# Patient Record
Sex: Female | Born: 1985 | Race: White | Hispanic: No | Marital: Single | Smoking: Current some day smoker
Health system: Southern US, Community
[De-identification: ages and names within clinical notes are randomized; demographics above are authoritative.]

## PROBLEM LIST (undated history)

## (undated) DIAGNOSIS — F329 Major depressive disorder, single episode, unspecified: Secondary | ICD-10-CM

## (undated) DIAGNOSIS — F419 Anxiety disorder, unspecified: Secondary | ICD-10-CM

## (undated) DIAGNOSIS — F32A Depression, unspecified: Secondary | ICD-10-CM

## (undated) HISTORY — DX: Major depressive disorder, single episode, unspecified: F32.9

## (undated) HISTORY — DX: Anxiety disorder, unspecified: F41.9

## (undated) HISTORY — DX: Depression, unspecified: F32.A

---
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
## 2015-06-08 ENCOUNTER — Emergency Department (HOSPITAL_COMMUNITY)
Admission: EM | Admit: 2015-06-08 | Discharge: 2015-06-09 | Disposition: A | Discharge status: Home / Self Care | Payer: Self-pay | Attending: Emergency Medicine | Admitting: Emergency Medicine

## 2015-06-08 ENCOUNTER — Encounter (HOSPITAL_COMMUNITY): Payer: Self-pay

## 2015-06-08 ENCOUNTER — Emergency Department (HOSPITAL_COMMUNITY): Payer: Self-pay

## 2015-06-08 DIAGNOSIS — R0789 Other chest pain: Secondary | ICD-10-CM | POA: Insufficient documentation

## 2015-06-08 DIAGNOSIS — Z87891 Personal history of nicotine dependence: Secondary | ICD-10-CM | POA: Insufficient documentation

## 2015-06-08 LAB — CBC
HCT: 40.2 % (ref 36.0–46.0)
Hemoglobin: 13.7 g/dL (ref 12.0–15.0)
MCH: 30.1 pg (ref 26.0–34.0)
MCHC: 34.1 g/dL (ref 30.0–36.0)
MCV: 88.4 fL (ref 78.0–100.0)
PLATELETS: 323 10*3/uL (ref 150–400)
RBC: 4.55 MIL/uL (ref 3.87–5.11)
RDW: 12.9 % (ref 11.5–15.5)
WBC: 11.5 10*3/uL — AB (ref 4.0–10.5)

## 2015-06-08 LAB — BASIC METABOLIC PANEL
Anion gap: 8 (ref 5–15)
BUN: 12 mg/dL (ref 6–20)
CALCIUM: 8.9 mg/dL (ref 8.9–10.3)
CO2: 22 mmol/L (ref 22–32)
CREATININE: 0.71 mg/dL (ref 0.44–1.00)
Chloride: 105 mmol/L (ref 101–111)
GFR calc non Af Amer: >60 mL/min (ref >60)
Glucose, Bld: 132 mg/dL — ABNORMAL HIGH (ref 65–99)
Potassium: 3.3 mmol/L — ABNORMAL LOW (ref 3.5–5.1)
SODIUM: 135 mmol/L (ref 135–145)

## 2015-06-08 LAB — I-STAT TROPONIN, ED: TROPONIN I, POC: 0 ng/mL (ref 0.00–0.08)

## 2015-06-08 NOTE — ED Provider Notes (Signed)
CSN: [id number]     Arrival date & time 06/08/15  2237 History   First MD Initiated Contact with Patient 06/08/15 2336     Chief Complaint  Patient presents with  . Chest Pain     (Consider location/radiation/quality/duration/timing/severity/associated sxs/prior Treatment) HPI Comments: Pt states she was at work tonight and started having left arm pain and then it went numb. An hour later it went away. Then she started having cp. No early familial cardiac history. No h/o DVT or PE.  Patient is a 29 y.o. female presenting with chest pain. The history is provided by the patient.  Chest Pain Pain location:  L chest Pain quality: pressure   Pain radiates to:  L arm Pain radiates to the back: no   Pain severity:  Mild Onset quality:  Sudden Duration:  8 hours Timing:  Constant Progression:  Improving Chronicity:  New Context: lifting   Relieved by:  Nothing Worsened by:  Movement Ineffective treatments: NSAIDs. Associated symptoms: no abdominal pain, no fever, no lower extremity edema, no nausea and not vomiting   Risk factors: no coronary artery disease, no diabetes mellitus, no high cholesterol, no hypertension and no prior DVT/PE     History reviewed. No pertinent past medical history. History reviewed. No pertinent past surgical history. No family history on file. Social History  Substance Use Topics  . Smoking status: Former Games developer  . Smokeless tobacco: None  . Alcohol Use: Yes     Comment: occasionally   OB History    No data available     Review of Systems  Constitutional: Negative for fever.  Cardiovascular: Positive for chest pain.  Gastrointestinal: Negative for nausea, vomiting and abdominal pain.  All other systems reviewed and are negative.     Allergies  Zithromax  Home Medications   Prior to Admission medications   Medication Sig Start Date End Date Taking? Authorizing Provider  naproxen (NAPROSYN) 500 MG tablet Take 1 tablet (500 mg total) by  mouth 2 (two) times daily with a meal. 06/09/15   Aluna Whiston, PA-C   BP 125/72 mmHg  Pulse 65  Temp(Src) 97.7 F (36.5 C) (Oral)  Resp 17  Ht  (1.778 m)  Wt 280 lb (127.007 kg)  BMI 40.18 kg/m2  SpO2 100% Physical Exam  Constitutional: She is oriented to person, place, and time. She appears well-developed and well-nourished.  HENT:  Head: Normocephalic and atraumatic.  Right Ear: External ear normal.  Left Ear: External ear normal.  Nose: Nose normal.  Eyes: Conjunctivae are normal.  Neck: Neck supple.  Cardiovascular: Normal rate, regular rhythm and normal heart sounds.   Pulmonary/Chest: Effort normal and breath sounds normal. No respiratory distress. She exhibits tenderness.  Abdominal: Soft. There is no tenderness.  Musculoskeletal: Normal range of motion. She exhibits no edema.  Neurological: She is alert and oriented to person, place, and time.  Skin: Skin is warm and dry.  Nursing note and vitals reviewed.   ED Course  Procedures (including critical care time) Labs Review Labs Reviewed  BASIC METABOLIC PANEL - Abnormal; Notable for the following:    Potassium 3.3 (*)    Glucose, Bld 132 (*)    All other components within normal limits  CBC - Abnormal; Notable for the following:    WBC 11.5 (*)    All other components within normal limits  Rosezena Sensor, ED    Imaging Review Dg Chest 2 View  06/08/2015   CLINICAL DATA:  Chest pain  EXAM: CHEST  2 VIEW  COMPARISON:  None.  FINDINGS: There is a possible 3 mm left apical pulmonary nodule. There is no focal parenchymal opacity. There is no pleural effusion or pneumothorax. The heart and mediastinal contours are unremarkable.  The osseous structures are unremarkable.  IMPRESSION: No active cardiopulmonary disease.  Possible 3 mm left apical pulmonary nodule. Recommend follow-up chest x-ray in 3-6 months.   Electronically Signed   By: Elige Ko   On: 06/08/2015 23:24   I have personally reviewed and  evaluated these images and lab results as part of my medical decision-making.   EKG Interpretation None      MDM   Final diagnoses:  Chest pain, atypical    Filed Vitals:   06/09/15 0151  BP: 125/72  Pulse: 65  Temp: 97.7 F (36.5 C)  Resp: 17   Afebrile, NAD, non-toxic appearing, AAOx4.   29 yo F presenting to the ED for CP. Low suspicion for PE as patient is not hypoxic, tachypnea or tachycardic, VSS, no tracheal deviation, no JVD or new murmur, RRR, breath sounds equal bilaterally, EKG without acute abnormalities, negative troponin, and negative CXR. Sensation intact. Moves extremities w/o ataxia. No early familial cardiac history. Advised to f/u with PCP. Return precautions discussed. Patient / Family / Caregiver informed of clinical course, understand medical decision-making and is agreeable to plan. Patient is stable at time of discharge      Francee Piccolo, PA-C 06/09/15 0250  Gilda Crease, MD 06/10/15 [phone]

## 2015-06-08 NOTE — ED Notes (Signed)
Pt states she was at work tonight and started having left arm pain and then it went numb. An hour later it went away. Then she started having cp.

## 2015-06-09 MED ORDER — TRAMADOL HCL 50 MG PO TABS
50.0000 mg | ORAL_TABLET | Freq: Once | ORAL | Status: AC
  Administered 2015-06-09: 50 mg via ORAL
  Filled 2015-06-09: qty 1

## 2015-06-09 MED ORDER — NAPROXEN 500 MG PO TABS: 500.0000 mg | ORAL_TABLET | Freq: Two times a day (BID) | ORAL | Status: AC

## 2015-06-09 NOTE — Discharge Instructions (Signed)
Please follow up with your primary care physician in 1-2 days. If you do not have one please call the [facility] and wellness Center number listed above. Your child received a long acting steroid for croup today. No further steroids are needed. If he/she has difficulty breathing, have him/her breath in cool air from the freezer or take him/her into the cool night air. If there is no improvement in 5 minutes or if your child has labored, heavy breathing return to the ED immediately.   Chest Pain (Nonspecific) It is often hard to give a specific diagnosis for the cause of chest pain. There is always a chance that your pain could be related to something serious, such as a heart attack or a blood clot in the lungs. You need to follow up with your health care provider for further evaluation. CAUSES   Heartburn.  Pneumonia or bronchitis.  Anxiety or stress.  Inflammation around your heart (pericarditis) or lung (pleuritis or pleurisy).  A blood clot in the lung.  A collapsed lung (pneumothorax). It can develop suddenly on its own (spontaneous pneumothorax) or from trauma to the chest.  Shingles infection (herpes zoster virus). The chest wall is composed of bones, muscles, and cartilage. Any of these can be the source of the pain.  The bones can be bruised by injury.  The muscles or cartilage can be strained by coughing or overwork.  The cartilage can be affected by inflammation and become sore (costochondritis). DIAGNOSIS  Lab tests or other studies may be needed to find the cause of your pain. Your health care provider may have you take a test called an ambulatory electrocardiogram (ECG). An ECG records your heartbeat patterns over a 24-hour period. You may also have other tests, such as:  Transthoracic echocardiogram (TTE). During echocardiography, sound waves are used to evaluate how blood flows through your heart.  Transesophageal echocardiogram (TEE).  Cardiac monitoring. This  allows your health care provider to monitor your heart rate and rhythm in real time.  Holter monitor. This is a portable device that records your heartbeat and can help diagnose heart arrhythmias. It allows your health care provider to track your heart activity for several days, if needed.  Stress tests by exercise or by giving medicine that makes the heart beat faster. TREATMENT   Treatment depends on what may be causing your chest pain. Treatment may include:  Acid blockers for heartburn.  Anti-inflammatory medicine.  Pain medicine for inflammatory conditions.  Antibiotics if an infection is present.  You may be advised to change lifestyle habits. This includes stopping smoking and avoiding alcohol, caffeine, and chocolate.  You may be advised to keep your head raised (elevated) when sleeping. This reduces the chance of acid going backward from your stomach into your esophagus. Most of the time, nonspecific chest pain will improve within 2-3 days with rest and mild pain medicine.  HOME CARE INSTRUCTIONS   If antibiotics were prescribed, take them as directed. Finish them even if you start to feel better.  For the next few days, avoid physical activities that bring on chest pain. Continue physical activities as directed.  Do not use any tobacco products, including cigarettes, chewing tobacco, or electronic cigarettes.  Avoid drinking alcohol.  Only take medicine as directed by your health care provider.  Follow your health care provider's suggestions for further testing if your chest pain does not go away.  Keep any follow-up appointments you made. If you do not go to an appointment, you  could develop lasting (chronic) problems with pain. If there is any problem keeping an appointment, call to reschedule. SEEK MEDICAL CARE IF:   Your chest pain does not go away, even after treatment.  You have a rash with blisters on your chest.  You have a fever. SEEK IMMEDIATE MEDICAL  CARE IF:   You have increased chest pain or pain that spreads to your arm, neck, jaw, back, or abdomen.  You have shortness of breath.  You have an increasing cough, or you cough up blood.  You have severe back or abdominal pain.  You feel nauseous or vomit.  You have severe weakness.  You faint.  You have chills. This is an emergency. Do not wait to see if the pain will go away. Get medical help at once. Call your local emergency services (911 in [place] Do not drive yourself to the hospital. MAKE SURE YOU:   Understand these instructions.  Will watch your condition.  Will get help right away if you are not doing well or get worse. Document Released: 07/11/2005 Document Revised: 10/06/2013 Document Reviewed: 05/06/2008 [facility] Patient Information 2015 [place], [place]. This information is not intended to replace advice given to you by your health care provider. Make sure you discuss any questions you have with your health care provider.

## 2015-10-20 ENCOUNTER — Ambulatory Visit (INDEPENDENT_AMBULATORY_CARE_PROVIDER_SITE_OTHER): Payer: Self-pay | Admitting: Family Medicine

## 2015-10-20 VITALS — BP 120/80 | HR 85 | Temp 98.4°F | Resp 16 | Ht 70.0 in | Wt 282.8 lb

## 2015-10-20 DIAGNOSIS — M791 Myalgia, unspecified site: Secondary | ICD-10-CM

## 2015-10-20 DIAGNOSIS — J0100 Acute maxillary sinusitis, unspecified: Secondary | ICD-10-CM

## 2015-10-20 DIAGNOSIS — R6883 Chills (without fever): Secondary | ICD-10-CM

## 2015-10-20 LAB — POCT INFLUENZA A/B
Influenza A, POC: NEGATIVE
Influenza B, POC: NEGATIVE

## 2015-10-20 MED ORDER — HYDROCODONE-HOMATROPINE 5-1.5 MG/5ML PO SYRP: 5.0000 mL | ORAL_SOLUTION | Freq: Every evening | ORAL | Status: AC | PRN

## 2015-10-20 MED ORDER — AMOXICILLIN 500 MG PO TABS: 500.0000 mg | ORAL_TABLET | Freq: Three times a day (TID) | ORAL | Status: DC

## 2015-10-20 MED ORDER — BENZONATATE 100 MG PO CAPS: 200.0000 mg | ORAL_CAPSULE | Freq: Two times a day (BID) | ORAL | Status: DC | PRN

## 2015-10-20 NOTE — Patient Instructions (Signed)

## 2015-10-20 NOTE — Progress Notes (Signed)
Chief Complaint:  Chief Complaint  Patient presents with  . Sinusitis    x 2 days    HPI: Shannon Harrison is a 30 y.o. female who reports to [facility] today complaining of 2 day history of sinus and HA and also sore thraot, and LAD. She works on the [employer]. She has had laryngitis.  No fevers, subjective chills. She She has ahd more aches and pains, and she took some hydrocodone that she ahd for tooth pain,.    Past Medical History  Diagnosis Date  . Anxiety   . Depression    History reviewed. No pertinent past surgical history. Social History   Social History  . Marital Status: Single    Spouse Name: N/A  . Number of Children: N/A  . Years of Education: N/A   Social History Main Topics  . Smoking status: Former Games developer  . Smokeless tobacco: None  . Alcohol Use: Yes     Comment: occasionally  . Drug Use: Yes    Special: Marijuana  . Sexual Activity: Not Asked   Other Topics Concern  . None   Social History Narrative   Family History  Problem Relation Age of Onset  . Cancer Mother   . Diabetes Father   . Hyperlipidemia Father   . Mental illness Father    Allergies  Allergen Reactions  . Shannon [Azithromycin] Other (See Comments)    abd pain   Prior to Admission medications   Medication Sig Start Date End Date Taking? Authorizing Provider  levonorgestrel (MIRENA) 20 MCG/24HR IUD 1 each by Intrauterine route once.   Yes Historical Provider, MD  naproxen (NAPROSYN) 500 MG tablet Take 1 tablet (500 mg total) by mouth 2 (two) times daily with a meal. Patient not taking: Reported on 10/20/2015 06/09/15   Victorino Dike Piepenbrink, PA-C     ROS: The patient denies fevers, chills, night sweats, unintentional weight loss, chest pain, palpitations, wheezing, dyspnea on exertion, nausea, vomiting, abdominal pain, dysuria, hematuria, melena, numbness, weakness, or tingling.   All other systems have been reviewed and were otherwise negative with the exception of  those mentioned in the HPI and as above.    PHYSICAL EXAM: Filed Vitals:   10/20/15 1524  BP: 120/80  Pulse: 85  Temp: 98.4 F (36.9 C)  Resp: 16   Body mass index is 40.58 kg/(m^2).   General: Alert, no acute distress HEENT:  Normocephalic, atraumatic, oropharynx patent. EOMI, PERRLA Erythematous throat, no exudates, TM normal, + sinus tenderness, + erythematous/boggy nasal mucosa Cardiovascular:  Regular rate and rhythm, no rubs murmurs or gallops.  No Carotid bruits, radial pulse intact. No pedal edema.  Respiratory: Clear to auscultation bilaterally.  No wheezes, rales, or rhonchi.  No cyanosis, no use of accessory musculature Abdominal: No organomegaly, abdomen is soft and non-tender, positive bowel sounds. No masses. Skin: No rashes. Neurologic: Facial musculature symmetric. Psychiatric: Patient acts appropriately throughout our interaction. Lymphatic: No cervical or submandibular lymphadenopathy Musculoskeletal: Gait intact. No edema, tenderness   LABS: Results for orders placed or performed in visit on 10/20/15  POCT Influenza A/B  Result Value Ref Range   Influenza A, POC Negative Negative   Influenza B, POC Negative Negative     EKG/XRAY:   Primary read interpreted by Dr. Conley Rolls at [facility].   ASSESSMENT/PLAN: Encounter Diagnoses  Name Primary?  . Chills (without fever) Yes  . Acute maxillary sinusitis, recurrence not specified   . Muscle ache    Rx amoxacillin  Rx tessalon perles Rx Hycodan prn FU prn , otc nasacort prn   Gross sideeffects, risk and benefits, and alternatives of medications d/w patient. Patient is aware that all medications have potential sideeffects and we are unable to predict every sideeffect or drug-drug interaction that may occur.  Yoltzin Ransom DO  10/20/2015 6:13 PM

## 2015-12-14 ENCOUNTER — Emergency Department (HOSPITAL_COMMUNITY)
Admission: EM | Admit: 2015-12-14 | Discharge: 2015-12-14 | Disposition: A | Discharge status: Home / Self Care | Payer: Self-pay | Source: Home / Self Care | Attending: Family Medicine | Admitting: Family Medicine

## 2015-12-14 ENCOUNTER — Encounter (HOSPITAL_COMMUNITY): Payer: Self-pay | Admitting: Emergency Medicine

## 2015-12-14 DIAGNOSIS — L720 Epidermal cyst: Secondary | ICD-10-CM

## 2015-12-14 NOTE — ED Provider Notes (Signed)
CSN: [id number]     Arrival date & time 12/14/15  1909 History   First MD Initiated Contact with Patient 12/14/15 2027     Chief Complaint  Patient presents with  . Cyst   (Consider location/radiation/quality/duration/timing/severity/associated sxs/prior Treatment) HPI Comments: 30 year old female presents to the urgent care tonight coursed by her mother according to her, to have what is believed to be a cyst under the right arm checked out. It has been there for approximately one month. Off and on. Occasionally it bothers her when moving her arm. A couple of days ago there was some drainage and she was able to express a small amount of cheesy material from the lesion. Since then it has disappeared. Currently there is no observed or palpable lesion. The patient has no complaints of pain and there is nothing bothering her now.    Past Medical History  Diagnosis Date  . Anxiety   . Depression    History reviewed. No pertinent past surgical history. Family History  Problem Relation Age of Onset  . Cancer Mother   . Diabetes Father   . Hyperlipidemia Father   . Mental illness Father    Social History  Substance Use Topics  . Smoking status: Former Games developer  . Smokeless tobacco: None  . Alcohol Use: Yes     Comment: occasionally   OB History    No data available     Review of Systems  Constitutional: Negative.   Skin: Negative for color change, pallor, rash and wound.  All other systems reviewed and are negative.   Allergies  Zithromax  Home Medications   Prior to Admission medications   Medication Sig Start Date End Date Taking? Authorizing Provider  levonorgestrel (MIRENA) 20 MCG/24HR IUD 1 each by Intrauterine route once.   Yes Historical Provider, MD  amoxicillin (AMOXIL) 500 MG tablet Take 1 tablet (500 mg total) by mouth 3 (three) times daily. 10/20/15   Thao P Le, DO  benzonatate (TESSALON) 100 MG capsule Take 2 capsules (200 mg total) by mouth 2 (two) times daily as  needed. 10/20/15   Thao P Le, DO  HYDROcodone-homatropine (HYCODAN) 5-1.5 MG/5ML syrup Take 5 mLs by mouth at bedtime as needed. 10/20/15   Thao P Le, DO  naproxen (NAPROSYN) 500 MG tablet Take 1 tablet (500 mg total) by mouth 2 (two) times daily with a meal. Patient not taking: Reported on 10/20/2015 06/09/15   Francee Piccolo, PA-C   Meds Ordered and Administered this Visit  Medications - No data to display  BP 139/99 mmHg  Pulse 92  Temp(Src) 98.2 F (36.8 C) (Oral)  SpO2 98% No data found.   Physical Exam  Constitutional: She is oriented to person, place, and time. She appears well-developed and well-nourished. No distress.  Eyes: EOM are normal.  Neck: Normal range of motion. Neck supple.  Cardiovascular: Normal rate.   Pulmonary/Chest: Effort normal. No respiratory distress.  Musculoskeletal: She exhibits no edema.  Neurological: She is alert and oriented to person, place, and time. She exhibits normal muscle tone.  Skin: Skin is warm and dry.  The right axilla region was palpated deeply. There is no observed or palpated lesion. No overlying skin changes. No discoloration, no tenderness, no erythema. No nodules bumps or lumps.  Psychiatric: She has a normal mood and affect.  Nursing note and vitals reviewed.   ED Course  Procedures (including critical care time)  Labs Review Labs Reviewed - No data to display  Imaging Review No  results found.   Visual Acuity Review  Right Eye Distance:   Left Eye Distance:   Bilateral Distance:    Right Eye Near:   Left Eye Near:    Bilateral Near:         MDM   1. Cyst of skin and subcutaneous tissue    Reassurance Return as needed Information on epidermal inclusion cyst   Hayden Rasmussen, NP 12/14/15 2104

## 2015-12-14 NOTE — ED Notes (Signed)
Pt states she had a cyst under her right arm pit about a month ago.  She accidentally opened it and it drained a lot of "black stuff that was the consistency of cottage cheese."  She states it felt fine after that, but then two weeks ago her mother looked at it and was palpating it for swelling and it has been uncomfortable since.  Pt states she has a high tolerance for pain so she really doesn't know how long she has had it or if it really hurts today.  She denies any fever.

## 2015-12-14 NOTE — Discharge Instructions (Signed)
Epidermal Cyst An epidermal cyst is sometimes called a sebaceous cyst, epidermal inclusion cyst, or infundibular cyst. These cysts usually contain a substance that looks "pasty" or "cheesy" and may have a bad smell. This substance is a protein called keratin. Epidermal cysts are usually found on the face, neck, or trunk. They may also occur in the vaginal area or other parts of the genitalia of both men and women. Epidermal cysts are usually small, painless, slow-growing bumps or lumps that move freely under the skin. It is important not to try to pop them. This may cause an infection and lead to tenderness and swelling. CAUSES  Epidermal cysts may be caused by a deep penetrating injury to the skin or a plugged hair follicle, often associated with acne. SYMPTOMS  Epidermal cysts can become inflamed and cause:  Redness.  Tenderness.  Increased temperature of the skin over the bumps or lumps.  Grayish-white, bad smelling material that drains from the bump or lump. DIAGNOSIS  Epidermal cysts are easily diagnosed by your caregiver during an exam. Rarely, a tissue sample (biopsy) may be taken to rule out other conditions that may resemble epidermal cysts. TREATMENT   Epidermal cysts often get better and disappear on their own. They are rarely ever cancerous.  If a cyst becomes infected, it may become inflamed and tender. This may require opening and draining the cyst. Treatment with antibiotics may be necessary. When the infection is gone, the cyst may be removed with minor surgery.  Small, inflamed cysts can often be treated with antibiotics or by injecting steroid medicines.  Sometimes, epidermal cysts become large and bothersome. If this happens, surgical removal in your caregiver's office may be necessary. HOME CARE INSTRUCTIONS  Only take over-the-counter or prescription medicines as directed by your caregiver.  Take your antibiotics as directed. Finish them even if you start to feel  better. SEEK MEDICAL CARE IF:   Your cyst becomes tender, red, or swollen.  Your condition is not improving or is getting worse.  You have any other questions or concerns. MAKE SURE YOU:  Understand these instructions.  Will watch your condition.  Will get help right away if you are not doing well or get worse.   This information is not intended to replace advice given to you by your health care provider. Make sure you discuss any questions you have with your health care provider.   Document Released: 09/01/2004 Document Revised: 12/24/2011 Document Reviewed: 04/09/2011 Elsevier Interactive Patient Education 2016 [employer]  

## 2016-05-07 ENCOUNTER — Ambulatory Visit (INDEPENDENT_AMBULATORY_CARE_PROVIDER_SITE_OTHER): Payer: Self-pay | Admitting: Physician Assistant

## 2016-05-07 ENCOUNTER — Ambulatory Visit (HOSPITAL_COMMUNITY)
Admission: RE | Admit: 2016-05-07 | Discharge: 2016-05-07 | Disposition: A | Discharge status: Home / Self Care | Payer: Self-pay | Source: Ambulatory Visit | Attending: Physician Assistant | Admitting: Physician Assistant

## 2016-05-07 VITALS — BP 132/84 | HR 87 | Temp 97.8°F | Resp 17 | Ht 69.5 in | Wt 297.0 lb

## 2016-05-07 DIAGNOSIS — K760 Fatty (change of) liver, not elsewhere classified: Secondary | ICD-10-CM | POA: Insufficient documentation

## 2016-05-07 DIAGNOSIS — R1012 Left upper quadrant pain: Secondary | ICD-10-CM | POA: Insufficient documentation

## 2016-05-07 LAB — POCT URINALYSIS DIP (MANUAL ENTRY)
BILIRUBIN UA: NEGATIVE
BILIRUBIN UA: NEGATIVE
Glucose, UA: NEGATIVE
LEUKOCYTES UA: NEGATIVE
Nitrite, UA: NEGATIVE
PROTEIN UA: NEGATIVE
SPEC GRAV UA: 1.025
Urobilinogen, UA: 0.2
pH, UA: 5

## 2016-05-07 LAB — COMPREHENSIVE METABOLIC PANEL
ALBUMIN: 4.3 g/dL (ref 3.6–5.1)
ALK PHOS: 59 U/L (ref 33–115)
ALT: 46 U/L — AB (ref 6–29)
AST: 36 U/L — AB (ref 10–30)
BILIRUBIN TOTAL: 1.1 mg/dL (ref 0.2–1.2)
BUN: 8 mg/dL (ref 7–25)
CALCIUM: 9.2 mg/dL (ref 8.6–10.2)
CO2: 25 mmol/L (ref 20–31)
Chloride: 102 mmol/L (ref 98–110)
Creat: 0.62 mg/dL (ref 0.50–1.10)
GLUCOSE: 99 mg/dL (ref 65–99)
POTASSIUM: 4.2 mmol/L (ref 3.5–5.3)
Sodium: 137 mmol/L (ref 135–146)
TOTAL PROTEIN: 7.3 g/dL (ref 6.1–8.1)

## 2016-05-07 LAB — POCT CBC
GRANULOCYTE PERCENT: 53.8 % (ref 37–80)
HCT, POC: 40.1 % (ref 37.7–47.9)
Hemoglobin: 14.4 g/dL (ref 12.2–16.2)
Lymph, poc: 4.5 — AB (ref 0.6–3.4)
MCH: 31 pg (ref 27–31.2)
MCHC: 36 g/dL — AB (ref 31.8–35.4)
MCV: 86.3 fL (ref 80–97)
MID (CBC): 0.7 (ref 0–0.9)
MPV: 7.2 fL (ref 0–99.8)
PLATELET COUNT, POC: 304 10*3/uL (ref 142–424)
POC Granulocyte: 6 (ref 2–6.9)
POC LYMPH %: 40.3 % (ref 10–50)
POC MID %: 5.9 %M (ref 0–12)
RBC: 4.64 M/uL (ref 4.04–5.48)
RDW, POC: 12.6 %
WBC: 11.1 10*3/uL — AB (ref 4.6–10.2)

## 2016-05-07 LAB — LIPASE: Lipase: 13 U/L (ref 7–60)

## 2016-05-07 LAB — POC MICROSCOPIC URINALYSIS (UMFC)

## 2016-05-07 LAB — POCT WET + KOH PREP
Trich by wet prep: ABSENT
YEAST BY KOH: ABSENT
YEAST BY WET PREP: ABSENT

## 2016-05-07 LAB — POCT URINE PREGNANCY: Preg Test, Ur: NEGATIVE

## 2016-05-07 MED ORDER — DIATRIZOATE MEGLUMINE & SODIUM 66-10 % PO SOLN
30.0000 mL | Freq: Once | ORAL | Status: AC
  Administered 2016-05-07: 30 mL via ORAL

## 2016-05-07 NOTE — Patient Instructions (Addendum)
Please report now to [facility] Admitting they will escort you to Radiology.  You should not eat or drink anything while you are on your way to the hospital.   For constipation   Make sure you are drinking enough water daily. Make sure you are getting enough fiber in your diet - this will make you regular - you can eat high fiber foods or use metamucil as a supplement - it is really important to drink enough water when using fiber supplements.  If your stools are hard or are formed balls or you have to strain a stool softener will help - use colace 2-3 capsule a day  For gentle treatment of constipation Use Miralax 1-2 capfuls a day until your stools are soft and regular and then decrease the usage - you can use this daily  For more aggressive treatment of constipation Use 4 capfuls of Colace and 6 doses of Miralax and drink it in 2 hours - this should result in several watery stools - if it does not repeat the next day and then go to daily miralax for a week to make sure your bowels are clean and retrained to work properly  For the most aggressive treatment of constipation Use 14 capfuls of Miralax in 1 gallon of fluid (gatoraid or water work well or a combination of the two) and drink over 12h - it is ok to eat during this time and then use Miralax 1 capful daily for about 2 weeks to prevent the constipation from returning     IF you received an x-ray today, you will receive an invoice from [facility] Radiology. Please contact [facility] at [phone] with questions or concerns regarding your invoice.   IF you received labwork today, you will receive an invoice from [employer]. Please contact [facility] at [phone] with questions or concerns regarding your invoice.   Our billing staff will not be able to assist you with questions regarding bills from these companies.  You will be contacted with the lab results as soon as they are available. The  fastest way to get your results is to activate your My Chart account. Instructions are located on the last page of this paperwork. If you have not heard from [place] regarding the results in 2 weeks, please contact this office.

## 2016-05-07 NOTE — Progress Notes (Signed)
[facility] [address] [place] [address] [phone]  Date:  05/07/2016   Name:  Shannon Harrison   DOB:  28-Nov-1985   MRN:  [id number]  PCP:  No primary care provider on file.    Chief Complaint: Abdominal Pain (LUQ )   History of Present Illness:  This is a 30 y.o. female who is presenting with LUQ abdominal pain x 1-2 months. Feels like pressure. Initially was intermittent, has become constant. She does not have insurance -- she was hoping the pain would go away but is getting worse. States it feels like when her spleen was enlarged 10 years ago from mono. 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 Constipation is rare for her. She has been having regular BMs. On Mirena, placed 1 year ago. Does not have periods. No new vaginal discharge. No urinary symptoms. Feels really tired. Getting regular 8 hour sleep at night. Now taking 1 hour nap during her lunch break. No fever, chills, cough, sob. Slept downstairs for 2 years with a mold infested wall. Recently moved upstairs. She states "my mom told me I need to mention that". States several people in her family have gotten mono relapses. She is wondering if that is what this could be. She is sexually active with long-term female partner. They use condoms. Last tested for STDs 3 years ago and negative. Mother diagnosed with breast cancer in her 50s. Uterine cancer in her 19s.  Review of Systems:  Review of Systems See HPI  There are no active problems to display for this patient.   Prior to Admission medications   Medication Sig Start Date End Date Taking? Authorizing Provider  HYDROcodone-homatropine (HYCODAN) 5-1.5 MG/5ML syrup Take 5 mLs by mouth at bedtime as needed. 10/20/15  Yes Thao P Le, DO  levonorgestrel (MIRENA) 20 MCG/24HR IUD 1 each by Intrauterine route once.   Yes Historical Provider, MD  Multiple Vitamins-Minerals (MULTIVITAMIN WITH MINERALS) tablet Take 1 tablet by mouth daily.   Yes Historical  Provider, MD  naproxen (NAPROSYN) 500 MG tablet Take 1 tablet (500 mg total) by mouth 2 (two) times daily with a meal. 06/09/15  Yes Francee Piccolo, PA-C           Allergies  Allergen Reactions  . Zithromax [Azithromycin] Other (See Comments)    abd pain    History reviewed. No pertinent surgical history.  Social History  Substance Use Topics  . Smoking status: Current Some Day Smoker    Types: Cigarettes  . Smokeless tobacco: Not on file  . Alcohol use Yes     Comment: occasionally    Family History  Problem Relation Age of Onset  . Cancer Mother   . Diabetes Father   . Hyperlipidemia Father   . Mental illness Father     Medication list has been reviewed and updated.  Physical Examination:  Physical Exam  Constitutional: She is oriented to person, place, and time. She appears well-developed and well-nourished. No distress.  HENT:  Head: Normocephalic and atraumatic.  Right Ear: Hearing, tympanic membrane, external ear and ear canal normal.  Left Ear: Hearing, tympanic membrane, external ear and ear canal normal.  Nose: Nose normal.  Mouth/Throat: Uvula is midline, oropharynx is clear and moist and mucous membranes are normal.  Eyes: Conjunctivae and lids are normal. Right eye exhibits no discharge. Left eye exhibits no discharge. No scleral icterus.  Cardiovascular: Normal rate, regular rhythm, normal heart sounds and normal pulses.   No murmur  heard. Pulmonary/Chest: Effort normal and breath sounds normal. No respiratory distress. She has no wheezes. She has no rhonchi. She has no rales. Right breast exhibits no inverted nipple, no mass, no nipple discharge and no skin change. Left breast exhibits no inverted nipple, no mass, no nipple discharge, no skin change and no tenderness. Breasts are symmetrical.  Abdominal: Soft. Bowel sounds are normal. There is tenderness in the left upper quadrant. There is rebound (generally throughout) and guarding (left side of  abdomen). There is no CVA tenderness.  Most tenderness at LUQ Mild tenderness, RLQ and suprapubic obese  Genitourinary: There is no tenderness on the right labia. There is no tenderness on the left labia. Uterus is tender (with palpation on lower abdomen). Cervix exhibits no motion tenderness, no discharge and no friability. Right adnexum displays tenderness (mild). Right adnexum displays no fullness. Left adnexum displays no tenderness and no fullness. No tenderness or bleeding in the vagina. Vaginal discharge (yellow/white, moderate) found.  Musculoskeletal: Normal range of motion.  Lymphadenopathy:       Head (right side): No submental, no submandibular, no tonsillar and no occipital adenopathy present.       Head (left side): No submental, no submandibular, no tonsillar and no occipital adenopathy present.    She has no cervical adenopathy.    She has no axillary adenopathy.  Neurological: She is alert and oriented to person, place, and time.  Skin: Skin is warm, dry and intact. No lesion and no rash noted.  Psychiatric: She has a normal mood and affect. Her speech is normal and behavior is normal. Thought content normal.   BP 132/84 (BP Location: Left Arm, Patient Position: Sitting, Cuff Size: Large)   Pulse 87   Temp 97.8 F (36.6 C) (Oral)   Resp 17   Ht 5' 9.5" (1.765 m)   Wt 297 lb (134.7 kg)   SpO2 96%   BMI 43.23 kg/m   Results for orders placed or performed in visit on 05/07/16  POCT CBC  Result Value Ref Range   WBC 11.1 (A) 4.6 - 10.2 K/uL   Lymph, poc 4.5 (A) 0.6 - 3.4   POC LYMPH PERCENT 40.3 10 - 50 %L   MID (cbc) 0.7 0 - 0.9   POC MID % 5.9 0 - 12 %M   POC Granulocyte 6.0 2 - 6.9   Granulocyte percent 53.8 37 - 80 %G   RBC 4.64 4.04 - 5.48 M/uL   Hemoglobin 14.4 12.2 - 16.2 g/dL   HCT, POC 00.4 59.9 - 47.9 %   MCV 86.3 80 - 97 fL   MCH, POC 31.0 27 - 31.2 pg   MCHC 36.0 (A) 31.8 - 35.4 g/dL   RDW, POC 77.4 %   Platelet Count, POC 304 142 - 424 K/uL   MPV  7.2 0 - 99.8 fL  POCT Microscopic Urinalysis ([facility])  Result Value Ref Range   WBC,UR,HPF,POC Few (A) None WBC/hpf   RBC,UR,HPF,POC None None RBC/hpf   Bacteria Moderate (A) None, Too numerous to count   Mucus Present (A) Absent   Epithelial Cells, UR Per Microscopy Moderate (A) None, Too numerous to count cells/hpf  POCT urinalysis dipstick  Result Value Ref Range   Color, UA yellow yellow   Clarity, UA clear clear   Glucose, UA negative negative   Bilirubin, UA negative negative   Ketones, POC UA negative negative   Spec Grav, UA 1.025    Blood, UA moderate (A) negative   pH,  UA 5.0    Protein Ur, POC negative negative   Urobilinogen, UA 0.2    Nitrite, UA Negative Negative   Leukocytes, UA Negative Negative  POCT urine pregnancy  Result Value Ref Range   Preg Test, Ur Negative Negative  POCT Wet + KOH Prep  Result Value Ref Range   Yeast by KOH Absent Present, Absent   Yeast by wet prep Absent Present, Absent   WBC by wet prep Few None, Few, Too numerous to count   Clue Cells Wet Prep HPF POC None None, Too numerous to count   Trich by wet prep Absent Present, Absent   Bacteria Wet Prep HPF POC Moderate (A) None, Few, Too numerous to count   Epithelial Cells By [employer] Pref ([facility]) Moderate (A) None, Few, Too numerous to count   RBC,UR,HPF,POC None None RBC/hpf   Assessment and Plan:  1. LUQ abdominal pain 2 months of LUQ abdominal pain, worsening. All labs normal here except mild leukocytosis with lymph predominant. G/C, lipase and CMP pending. Vital normals and patient appears well, however she did have some guarding on exam and +rebound tenderness. We discussed options including xray, u/s, CT. Pt opted for CT abdomen -- CT abdomen negative except for hepatic steatosis. Will treat like constipation for now -- she will take miralax BID x 3 days and then QD x 1 week. She will let me know how she is doing in 1 week and will go from there. Return if symptoms worsen in the  meantime. - POCT CBC - POCT Microscopic Urinalysis ([facility]) - POCT urinalysis dipstick - POCT urine pregnancy - Comprehensive metabolic panel - POCT Wet + KOH Prep - GC/Chlamydia Probe Amp - Lipase - CT Abdomen Pelvis Wo Contrast; Future   [employer]. Dyke Brackett, MHS [facility]  05/07/2016

## 2016-05-08 LAB — GC/CHLAMYDIA PROBE AMP
CT PROBE, AMP APTIMA: NOT DETECTED
GC Probe RNA: NOT DETECTED

## 2016-05-08 LAB — EPSTEIN-BARR VIRUS VCA ANTIBODY PANEL
EBV NA IGG: 99.3 U/mL — AB
EBV VCA IgM: <36 U/mL

## 2016-05-09 ENCOUNTER — Telehealth: Payer: Self-pay

## 2016-05-09 NOTE — Telephone Encounter (Signed)
Pt is needing to go over her ct scan results again   Best number [phone]

## 2016-05-10 NOTE — Telephone Encounter (Signed)
Attempted to call pt, left VM for pt to call back asap  

## 2018-01-16 IMAGING — CT CT ABD-PELV W/O CM
2 of 4 series · 16 of 46 positions shown, 18 images · non-contrast
Comparison: None.

CLINICAL DATA: Left upper quadrant abdominal pain for 2 months.
Guarding with rebound.

EXAM:
CT ABDOMEN AND PELVIS WITHOUT CONTRAST
TECHNIQUE: Multidetector CT imaging of the abdomen and pelvis was performed
following the standard protocol without IV contrast.

[Series 2: rtn a/p w/o · axial · non-contrast · 0.86mm/px · z∈[-527,-112]mm · 13 of 95 slices shown, 15 images]
[im 6/95  soft-tissue]
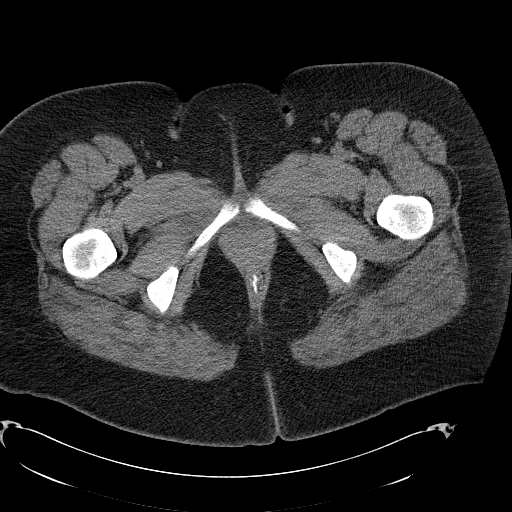
[im 6/95  bone]
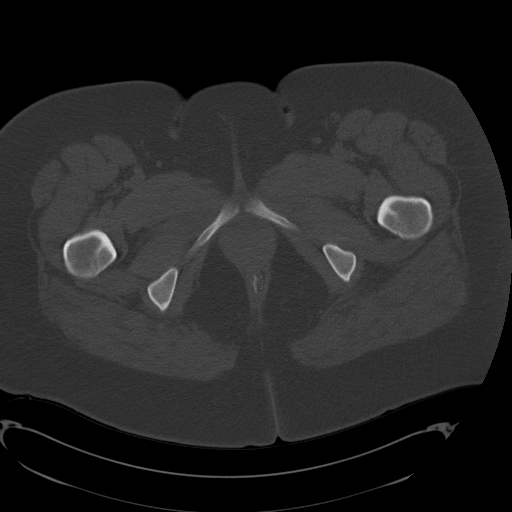
[im 11/95  soft-tissue]
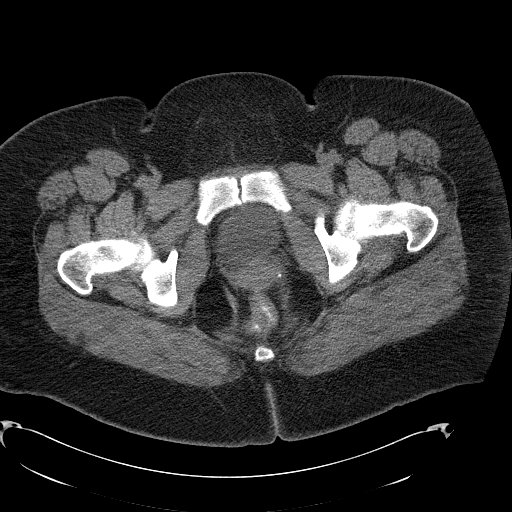
[im 21/95  soft-tissue]
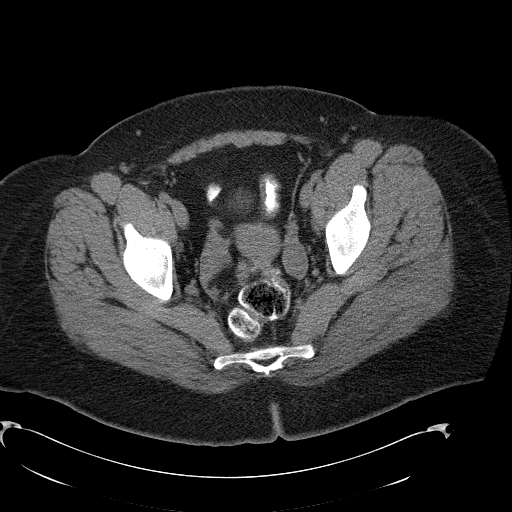
[im 27/95  soft-tissue]
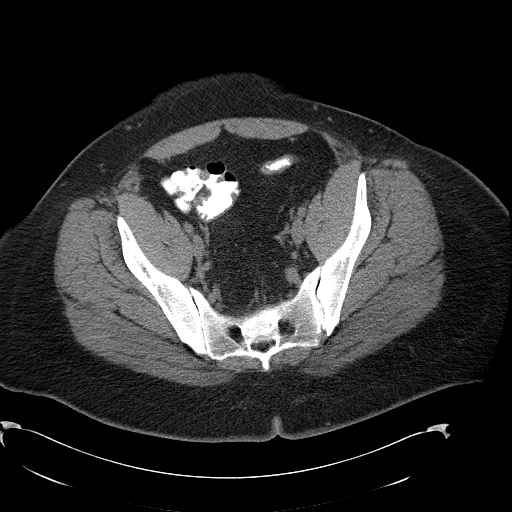
[im 32/95  soft-tissue]
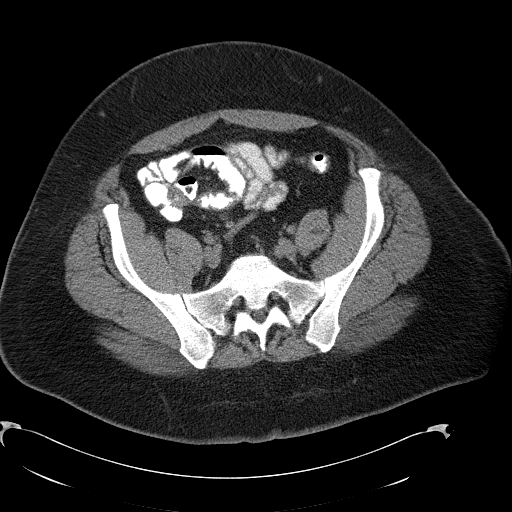
[im 42/95  soft-tissue]
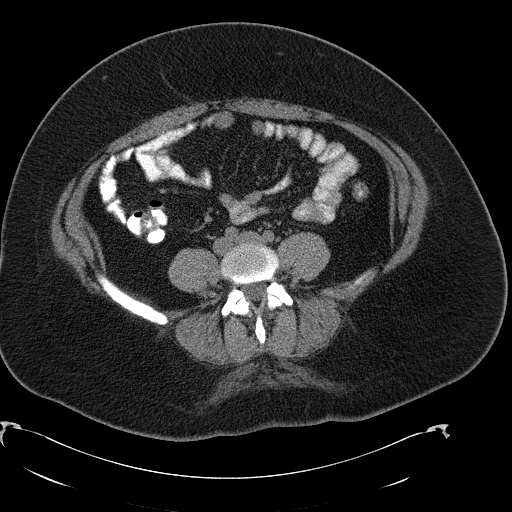
[im 48/95  soft-tissue]
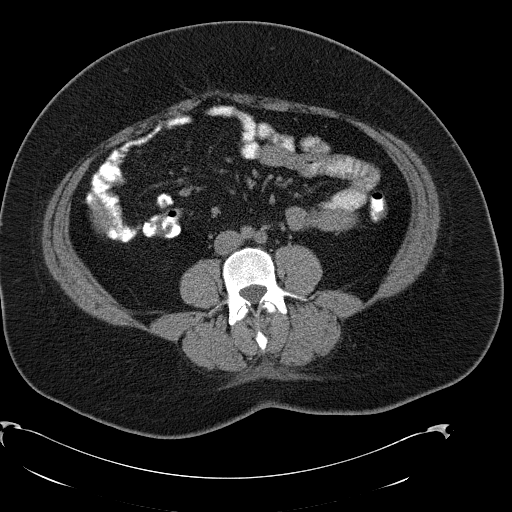
[im 53/95  soft-tissue]
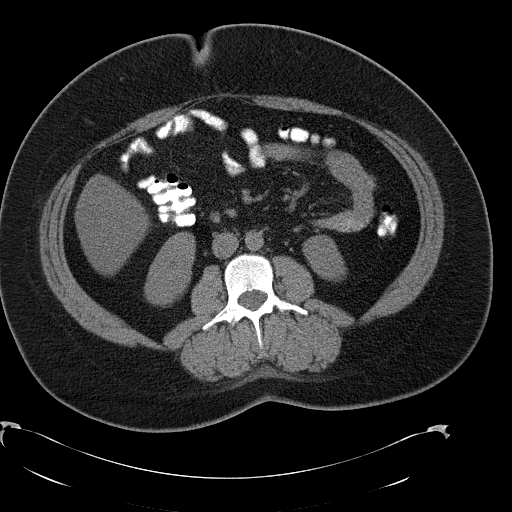
[im 63/95  soft-tissue]
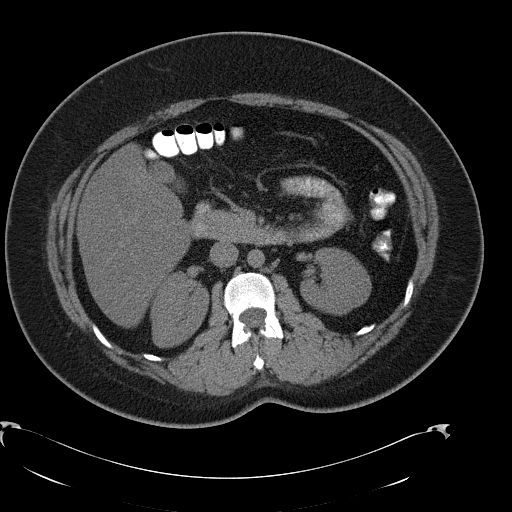
[im 63/95  bone]
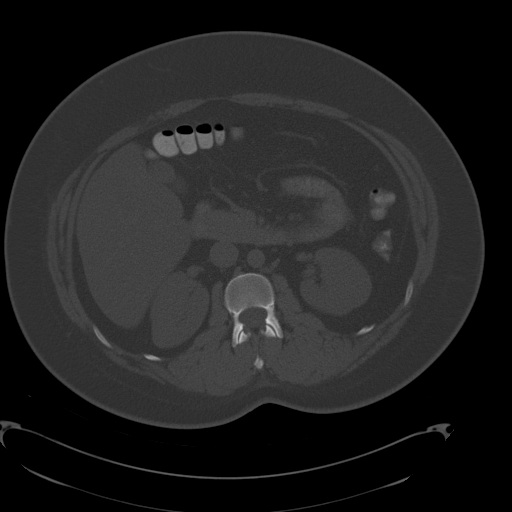
[im 68/95  soft-tissue]
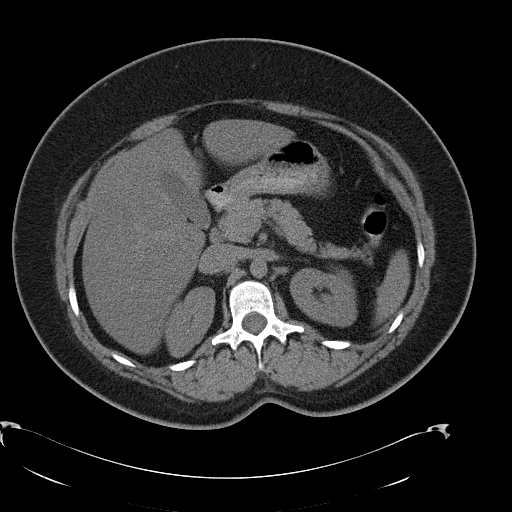
[im 74/95  soft-tissue]
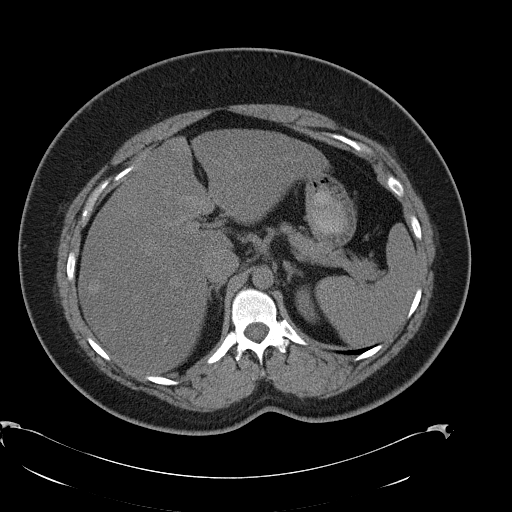
[im 84/95  soft-tissue]
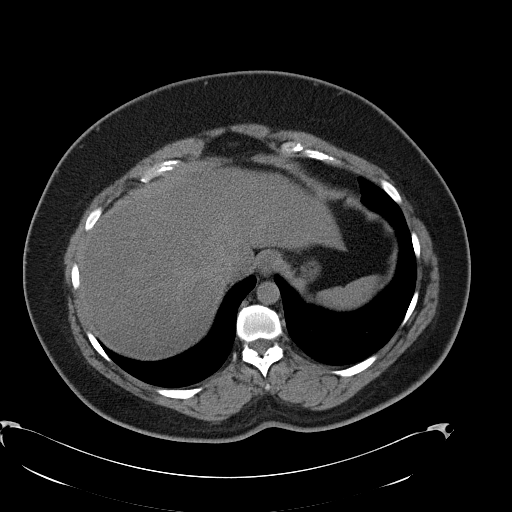
[im 89/95  soft-tissue]
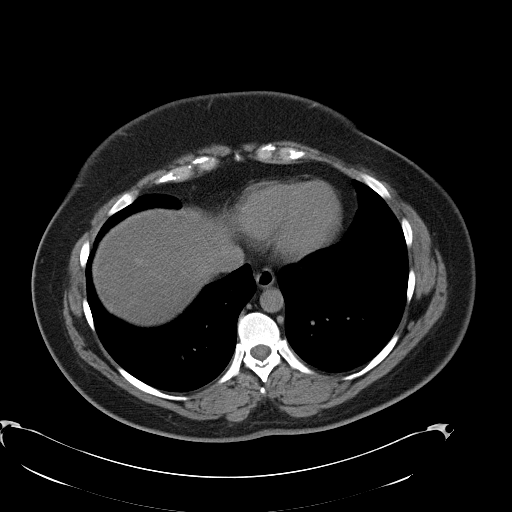

[Series 603: <mpr thick range(1)> · coronal · 0.92mm/px · 3 of 121 slices shown]
[im 41/121  soft-tissue]
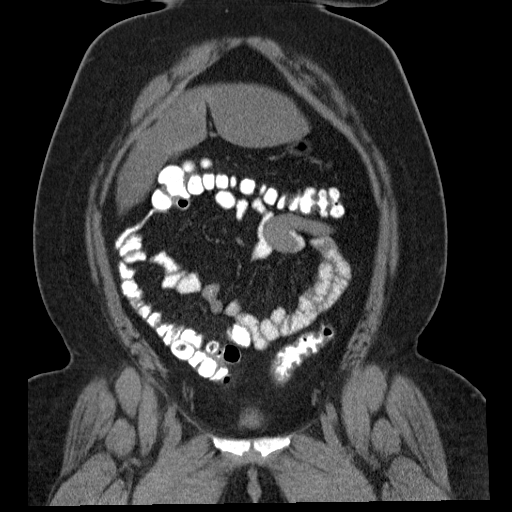
[im 54/121  soft-tissue]
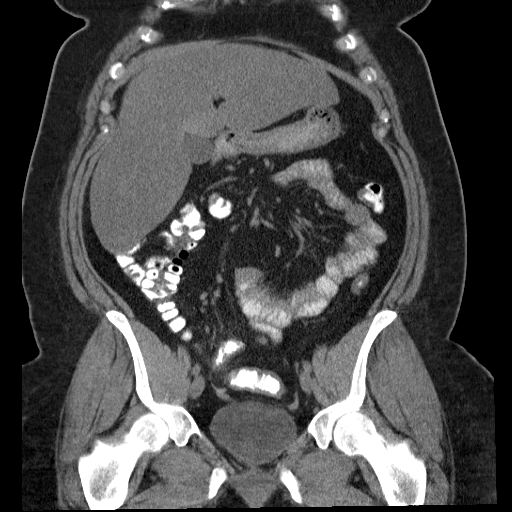
[im 67/121  soft-tissue]
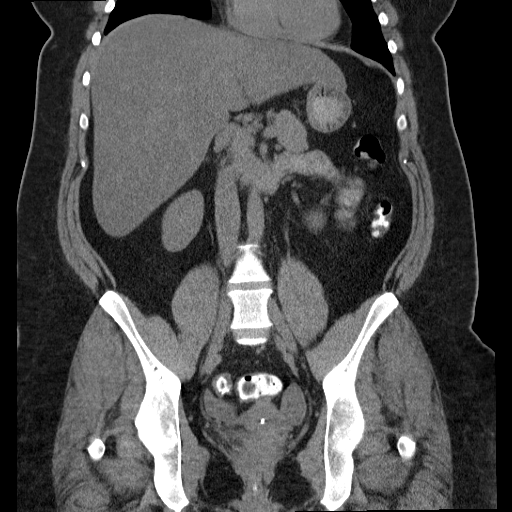

[16 of 46 positions shown; findings below may reference images not displayed]

FINDINGS: Lower chest: Clear lung bases. No significant pleural or pericardial
effusion.

Hepatobiliary: The liver is enlarged with severe steatosis. There is
relative sparing around the gallbladder, a typical location. In
addition, there are nonspecific scattered hyperdense lesions,
measuring 8 mm in the dome of the right hepatic lobe on image number
7 and 14 x 10 mm more inferiorly in the right lobe on image 22. No
evidence of gallstones, gallbladder wall thickening or biliary
dilatation.

Pancreas: Unremarkable. No pancreatic ductal dilatation or
surrounding inflammatory changes.

Spleen: Normal in size without focal abnormality. Small splenule
anteriorly.

Adrenals/Urinary Tract: Both adrenal glands appear normal. Both
kidneys appear unremarkable as imaged in the noncontrast state. No
evidence of urinary tract calculus or hydronephrosis. The bladder
appears normal.

Stomach/Bowel: No evidence of bowel wall thickening, distention or
surrounding inflammatory change. No significant colonic
diverticulosis. The appendix appears normal.

Vascular/Lymphatic: There are no enlarged abdominal or pelvic lymph
nodes. No significant vascular findings on noncontrast imaging.

Reproductive: Intrauterine device noted. The uterus otherwise
appears normal. No evidence of adnexal mass or pelvic inflammation.

Other: No evidence of abdominal wall mass or hernia.

Musculoskeletal: No acute or significant osseous findings.
IMPRESSION: 1. No acute findings or explanation for left upper quadrant
abdominal pain.
2. Hepatic steatosis with nonspecific relatively dense small hepatic
lesions. These may reflect areas of focal sparing or other benign
findings given the patient's young age. These could be more
definitively characterized with MRI with and without contrast if
clinically warranted.
